# Patient Record
Sex: Male | Born: 2000
Health system: Southern US, Community
[De-identification: ages and names within clinical notes are randomized; demographics above are authoritative.]

## PROBLEM LIST (undated history)

## (undated) HISTORY — PX: TYMPANOSTOMY TUBE PLACEMENT: SHX32

---
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
## 2021-01-02 DIAGNOSIS — Z23 Encounter for immunization: Secondary | ICD-10-CM | POA: Diagnosis not present

## 2021-03-13 DIAGNOSIS — M2142 Flat foot [pes planus] (acquired), left foot: Secondary | ICD-10-CM | POA: Diagnosis not present

## 2021-03-13 DIAGNOSIS — M9903 Segmental and somatic dysfunction of lumbar region: Secondary | ICD-10-CM | POA: Diagnosis not present

## 2021-03-13 DIAGNOSIS — M2141 Flat foot [pes planus] (acquired), right foot: Secondary | ICD-10-CM | POA: Diagnosis not present

## 2021-03-21 DIAGNOSIS — Z1331 Encounter for screening for depression: Secondary | ICD-10-CM | POA: Diagnosis not present

## 2021-03-21 DIAGNOSIS — Z6821 Body mass index (BMI) 21.0-21.9, adult: Secondary | ICD-10-CM | POA: Diagnosis not present

## 2021-03-21 DIAGNOSIS — Z0000 Encounter for general adult medical examination without abnormal findings: Secondary | ICD-10-CM | POA: Diagnosis not present

## 2021-03-30 DIAGNOSIS — Z23 Encounter for immunization: Secondary | ICD-10-CM | POA: Diagnosis not present

## 2022-03-28 DIAGNOSIS — Z0000 Encounter for general adult medical examination without abnormal findings: Secondary | ICD-10-CM | POA: Diagnosis not present

## 2022-03-29 ENCOUNTER — Other Ambulatory Visit (HOSPITAL_COMMUNITY): Payer: Self-pay

## 2022-03-29 MED ORDER — VITAMIN D (ERGOCALCIFEROL) 1.25 MG (50000 UNIT) PO CAPS
50000.0000 [IU] | ORAL_CAPSULE | ORAL | 0 refills | Status: AC
  Filled 2022-03-29: qty 12, 84d supply, fill #0

## 2022-03-30 ENCOUNTER — Other Ambulatory Visit (HOSPITAL_COMMUNITY): Payer: Self-pay

## 2022-05-01 ENCOUNTER — Other Ambulatory Visit (HOSPITAL_COMMUNITY): Payer: Self-pay

## 2022-09-04 DIAGNOSIS — F4322 Adjustment disorder with anxiety: Secondary | ICD-10-CM | POA: Diagnosis not present

## 2022-09-17 DIAGNOSIS — F4322 Adjustment disorder with anxiety: Secondary | ICD-10-CM | POA: Diagnosis not present

## 2022-10-04 DIAGNOSIS — F4322 Adjustment disorder with anxiety: Secondary | ICD-10-CM | POA: Diagnosis not present

## 2022-10-30 DIAGNOSIS — F4322 Adjustment disorder with anxiety: Secondary | ICD-10-CM | POA: Diagnosis not present

## 2022-11-02 DIAGNOSIS — H5213 Myopia, bilateral: Secondary | ICD-10-CM | POA: Diagnosis not present

## 2023-01-04 DIAGNOSIS — Z23 Encounter for immunization: Secondary | ICD-10-CM | POA: Diagnosis not present

## 2023-11-07 ENCOUNTER — Other Ambulatory Visit (HOSPITAL_COMMUNITY): Payer: Self-pay

## 2023-11-07 ENCOUNTER — Other Ambulatory Visit (HOSPITAL_BASED_OUTPATIENT_CLINIC_OR_DEPARTMENT_OTHER): Payer: Self-pay

## 2023-11-07 DIAGNOSIS — Z0001 Encounter for general adult medical examination with abnormal findings: Secondary | ICD-10-CM | POA: Diagnosis not present

## 2023-11-07 DIAGNOSIS — Z6821 Body mass index (BMI) 21.0-21.9, adult: Secondary | ICD-10-CM | POA: Diagnosis not present

## 2023-11-07 DIAGNOSIS — E559 Vitamin D deficiency, unspecified: Secondary | ICD-10-CM | POA: Diagnosis not present

## 2023-11-07 MED ORDER — OMEPRAZOLE 20 MG PO CPDR
20.0000 mg | DELAYED_RELEASE_CAPSULE | Freq: Every day | ORAL | 3 refills | Status: AC
  Filled 2023-11-07: qty 90, 90d supply, fill #0

## 2023-11-07 MED ORDER — OMEPRAZOLE 20 MG PO TBEC
20.0000 mg | DELAYED_RELEASE_TABLET | Freq: Every day | ORAL | 3 refills | Status: AC
  Filled 2023-11-07: qty 84, 84d supply, fill #0

## 2023-11-08 ENCOUNTER — Encounter: Payer: Self-pay | Admitting: Pharmacist

## 2023-11-08 ENCOUNTER — Other Ambulatory Visit: Payer: Self-pay

## 2023-11-13 ENCOUNTER — Other Ambulatory Visit: Payer: Self-pay

## 2024-02-04 ENCOUNTER — Other Ambulatory Visit (HOSPITAL_BASED_OUTPATIENT_CLINIC_OR_DEPARTMENT_OTHER): Payer: Self-pay

## 2024-02-04 MED ORDER — FLUZONE 0.5 ML IM SUSY
0.5000 mL | PREFILLED_SYRINGE | Freq: Once | INTRAMUSCULAR | 0 refills | Status: AC
  Filled 2024-02-04: qty 0.5, 1d supply, fill #0
